# Patient Record
Sex: Female | Born: 1970 | Hispanic: Yes | Marital: Married | Smoking: Never smoker
Health system: Southern US, Community
[De-identification: ages and names within clinical notes are randomized; demographics above are authoritative.]

---

## 2013-10-07 ENCOUNTER — Emergency Department: Payer: Self-pay

## 2013-10-07 LAB — RAPID INFLUENZA A&B ANTIGENS (ARMC ONLY)

## 2013-10-30 ENCOUNTER — Ambulatory Visit: Payer: Self-pay

## 2017-11-21 ENCOUNTER — Ambulatory Visit: Payer: Self-pay | Attending: Oncology

## 2017-11-21 ENCOUNTER — Ambulatory Visit
Admission: RE | Admit: 2017-11-21 | Discharge: 2017-11-21 | Disposition: A | Discharge status: Home / Self Care | Payer: Self-pay | Source: Ambulatory Visit | Attending: Oncology | Admitting: Oncology

## 2017-11-21 VITALS — BP 119/81 | HR 80 | Temp 98.1°F | Ht 63.0 in | Wt 150.0 lb

## 2017-11-21 DIAGNOSIS — N63 Unspecified lump in unspecified breast: Secondary | ICD-10-CM

## 2017-11-21 NOTE — Progress Notes (Signed)
Subjective:     Patient ID: Pennie RushingReyna Lopez Alvarez, female   DOB: 03/29/1971, 47 y.o.   MRN: [id number]  HPI   Review of Systems     Objective:   Physical Exam  Pulmonary/Chest: Right breast exhibits mass. Right breast exhibits no inverted nipple, no nipple discharge, no skin change and no tenderness. Left breast exhibits no inverted nipple, no mass, no nipple discharge, no skin change and no tenderness. Breasts are symmetrical.    Bilateral upper outer quadrant thickening       Assessment:     47 year old patient presents for [facility] clinic visit.  Referred for right breast mass.  No previous mammogram.     Patient screened, and meets [facility] eligibility.  Patient does not have insurance, Medicare or Medicaid.  Handout given on Affordable Care Act. Instructed patient on breast self awareness using teach back method.  Clinical exam reveals a 1.5 cm.  Firm, mobile, right breast mass at 5 o'clock adjacent to areola.    Plan:     Sent for bilateral diagnostic mammogram, and ultrasound.

## 2017-11-27 ENCOUNTER — Other Ambulatory Visit: Payer: Self-pay

## 2017-11-27 DIAGNOSIS — N63 Unspecified lump in unspecified breast: Secondary | ICD-10-CM

## 2017-12-02 NOTE — Progress Notes (Signed)
Mammogram and ultrasound results, Birads 3.  Patient scheduled to return for a right breast ultrasound on 05/10/18 at 11:00.  Mailed appointment information to patient.  Copy to [facility].

## 2018-05-10 ENCOUNTER — Other Ambulatory Visit: Payer: Self-pay

## 2018-05-10 ENCOUNTER — Ambulatory Visit
Admission: RE | Admit: 2018-05-10 | Discharge: 2018-05-10 | Disposition: A | Discharge status: Home / Self Care | Payer: Self-pay | Source: Ambulatory Visit | Attending: Oncology | Admitting: Oncology

## 2018-05-10 DIAGNOSIS — N63 Unspecified lump in unspecified breast: Secondary | ICD-10-CM

## 2018-05-10 NOTE — Progress Notes (Signed)
Radiologist discussed Birads 3 findings with patient.  To be scheduled for 6 month follow-up and annual mammogram.  Copy to [facility].

## 2018-05-25 NOTE — Progress Notes (Signed)
Patient scheduled for 6 month follow-up mammogram and annual [facility] appointment on 11/29/18 at 8:30.  Letter mailed with appointment info.

## 2018-11-29 ENCOUNTER — Ambulatory Visit
Admission: RE | Admit: 2018-11-29 | Discharge: 2018-11-29 | Disposition: A | Discharge status: Home / Self Care | Payer: Self-pay | Source: Ambulatory Visit | Attending: Oncology | Admitting: Oncology

## 2018-11-29 ENCOUNTER — Other Ambulatory Visit: Payer: Self-pay

## 2018-11-29 ENCOUNTER — Ambulatory Visit: Payer: Self-pay

## 2018-11-29 DIAGNOSIS — N63 Unspecified lump in unspecified breast: Secondary | ICD-10-CM

## 2018-12-04 NOTE — Progress Notes (Addendum)
Patient returns for six month follow-up right breast mass, and annual mammogram.  She is scheduled for 11/29/2018.  Patient screened, and meets [facility] eligibility.  Patient does not have insurance, Medicare or Medicaid.  Handout given on Affordable Care Act.  Sent for diagnostic bilateral mammogram, and ultrasound.

## 2018-12-04 NOTE — Progress Notes (Signed)
Birads 3 mammogram results recommending annual diagnostic mammogram.  Scheduled for annual and follow-up on 12/05/2019 at 12:30.  Letter mailed to notify patient of appointment.

## 2019-05-09 ENCOUNTER — Other Ambulatory Visit: Payer: Self-pay

## 2019-05-09 DIAGNOSIS — Z20822 Contact with and (suspected) exposure to covid-19: Secondary | ICD-10-CM

## 2019-05-10 LAB — NOVEL CORONAVIRUS, NAA: SARS-CoV-2, NAA: NOT DETECTED

## 2019-05-11 ENCOUNTER — Other Ambulatory Visit: Payer: Self-pay | Admitting: *Deleted

## 2019-05-11 DIAGNOSIS — Z20822 Contact with and (suspected) exposure to covid-19: Secondary | ICD-10-CM

## 2019-05-13 LAB — NOVEL CORONAVIRUS, NAA: SARS-CoV-2, NAA: NOT DETECTED

## 2019-11-24 ENCOUNTER — Ambulatory Visit: Payer: Self-pay | Attending: Internal Medicine

## 2019-11-24 DIAGNOSIS — Z23 Encounter for immunization: Secondary | ICD-10-CM

## 2019-11-24 NOTE — Progress Notes (Signed)
   Covid-19 Vaccination Clinic  Name:  Sheila Lopez    MRN: [id number] DOB: 10-Apr-1971  11/24/2019  Ms. Sheila Lopez was observed post Covid-19 immunization for 15 minutes without incident. She was provided with Vaccine Information Sheet and instruction to access the V-Safe system.   Ms. Sheila Lopez was instructed to call 911 with any severe reactions post vaccine: Marland Kitchen Difficulty breathing  . Swelling of face and throat  . A fast heartbeat  . A bad rash all over body  . Dizziness and weakness   Immunizations Administered    Name Date Dose VIS Date Route   Pfizer COVID-19 Vaccine 11/24/2019  2:40 PM 0.3 mL 08/17/2019 Intramuscular   Manufacturer: [employer], [employer]   Lot: [id number]   NDC: [address]-6

## 2019-12-05 ENCOUNTER — Ambulatory Visit
Admission: RE | Admit: 2019-12-05 | Discharge: 2019-12-05 | Disposition: A | Discharge status: Home / Self Care | Payer: Self-pay | Source: Ambulatory Visit | Attending: Oncology | Admitting: Oncology

## 2019-12-05 ENCOUNTER — Ambulatory Visit: Payer: Self-pay | Attending: Oncology

## 2019-12-05 ENCOUNTER — Other Ambulatory Visit: Payer: Self-pay

## 2019-12-05 VITALS — BP 131/80 | HR 74 | Temp 97.1°F | Ht 61.5 in | Wt 153.4 lb

## 2019-12-05 DIAGNOSIS — Z0000 Encounter for general adult medical examination without abnormal findings: Secondary | ICD-10-CM | POA: Insufficient documentation

## 2019-12-05 DIAGNOSIS — N63 Unspecified lump in unspecified breast: Secondary | ICD-10-CM

## 2019-12-05 NOTE — Progress Notes (Signed)
  Subjective:     Patient ID: Sheila Lopez, female   DOB: 05-01-1971, 49 y.o.   MRN: [id number]  HPI   Review of Systems     Objective:   Physical Exam Chest:     Breasts:        Right: Mass present. No swelling, bleeding, inverted nipple, nipple discharge, skin change or tenderness.        Left: No swelling, bleeding, inverted nipple, mass, nipple discharge, skin change or tenderness.    Genitourinary:    Labia:        Right: No rash, tenderness, lesion or injury.        Left: No rash, tenderness, lesion or injury.      Vagina: No signs of injury and foreign body. No vaginal discharge, erythema, tenderness, bleeding, lesions or prolapsed vaginal walls.     Cervix: No cervical motion tenderness, discharge, friability, lesion, erythema, cervical bleeding or eversion.     Uterus: Not deviated, not fixed, not tender and no uterine prolapse.      Adnexa:        Right: No mass, tenderness or fullness.         Left: No mass, tenderness or fullness.          Assessment:     49 year old Hispanic patient returns for annual screening. Patient is being followed for bilateral breast masses. Sheila Lopez interpreted exam. Patient screened, and meets [facility] eligibility.  Patient does not have insurance, Medicare or Medicaid. Instructed patient on breast self awareness using teach back method. Clinical breast exam unremarkable. Bilateral upper outer quadrant thickening.  Pelvic exam normal.    Plan:     Specimen collected for pap. Sent for bilateral diagnostic mammogram/ultrasound

## 2019-12-07 LAB — IGP, APTIMA HPV: HPV Aptima: NEGATIVE

## 2019-12-15 ENCOUNTER — Ambulatory Visit: Payer: Self-pay | Attending: Internal Medicine

## 2019-12-15 DIAGNOSIS — Z23 Encounter for immunization: Secondary | ICD-10-CM

## 2019-12-15 NOTE — Progress Notes (Signed)
   Covid-19 Vaccination Clinic  Name:  Sheila Lopez    MRN: [id number] DOB: 1971-06-15  12/15/2019  Ms. Sheila Lopez was observed post Covid-19 immunization for 15 minutes without incident. She was provided with Vaccine Information Sheet and instruction to access the V-Safe system.   Ms. Sheila Lopez was instructed to call 911 with any severe reactions post vaccine: Marland Kitchen Difficulty breathing  . Swelling of face and throat  . A fast heartbeat  . A bad rash all over body  . Dizziness and weakness   Immunizations Administered    Name Date Dose VIS Date Route   Pfizer COVID-19 Vaccine 12/15/2019 11:12 AM 0.3 mL 08/17/2019 Intramuscular   Manufacturer: [employer], [employer]   Lot: [id number]   NDC: [address]-0

## 2020-03-27 NOTE — Progress Notes (Signed)
Notified patient of Birads 1 mammogram, and negative/negative pap results.  Patient  Instructed to return for annual screening.  Copy to [facility].

## 2021-11-19 IMAGING — MG DIGITAL DIAGNOSTIC BILAT W/ TOMO W/ CAD
8 series · 8 of 24 positions shown · non-contrast
Comparison: Previous exam(s).

CLINICAL DATA: Two year interval follow-up of a likely benign mass
involving the LOWER INNER QUADRANT of the RIGHT breast, possibly a
fibroadenoma, identified on baseline screening mammography in 3287.
Annual evaluation, LEFT breast.

EXAM:
DIGITAL DIAGNOSTIC BILATERAL MAMMOGRAM WITH CAD AND TOMO
ULTRASOUND RIGHT BREAST

[R MLO synth-2D]
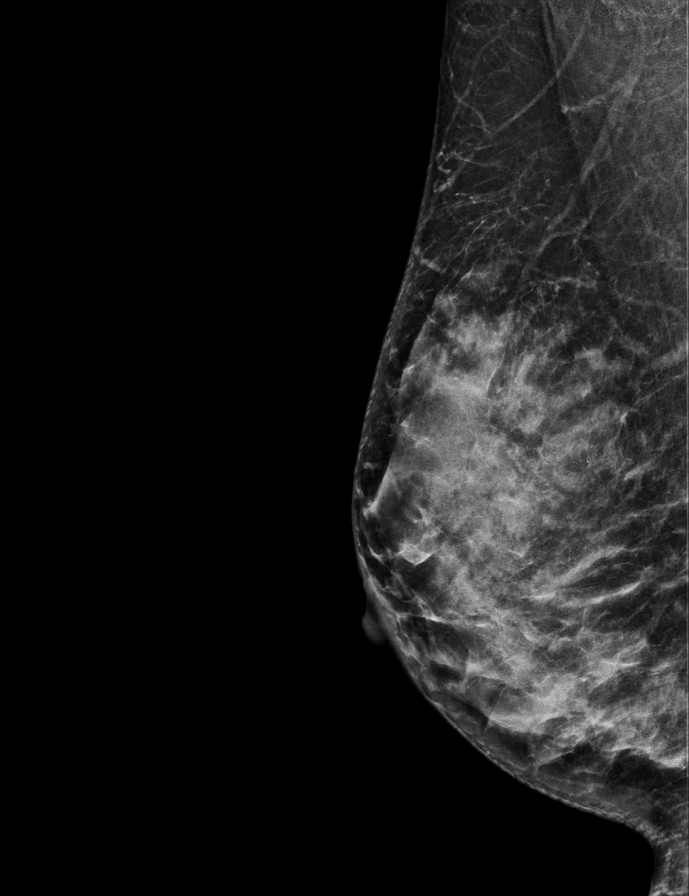

[L CC synth-2D]
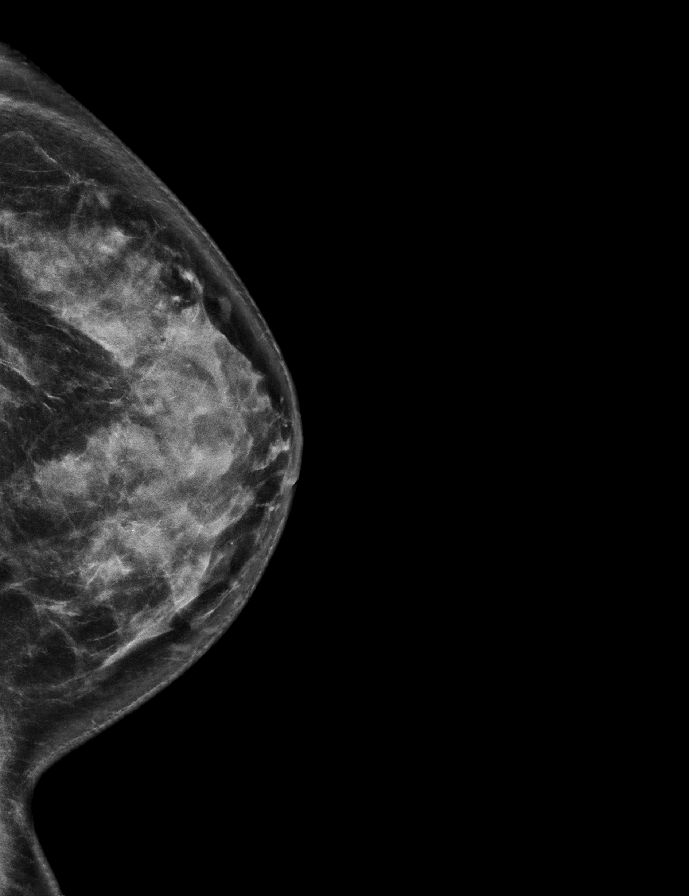

[L MLO synth-2D]
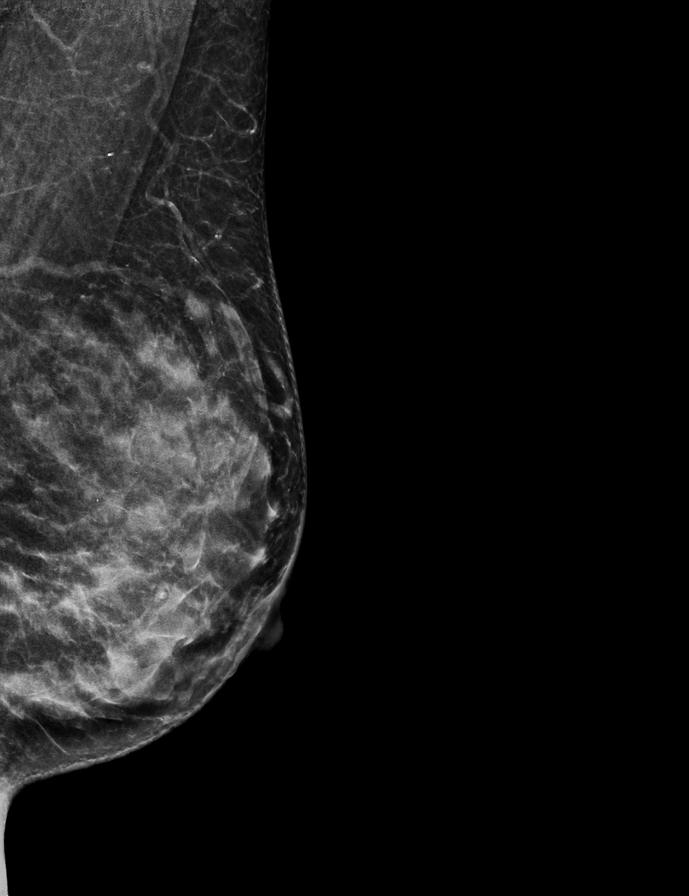

[R CC synth-2D]
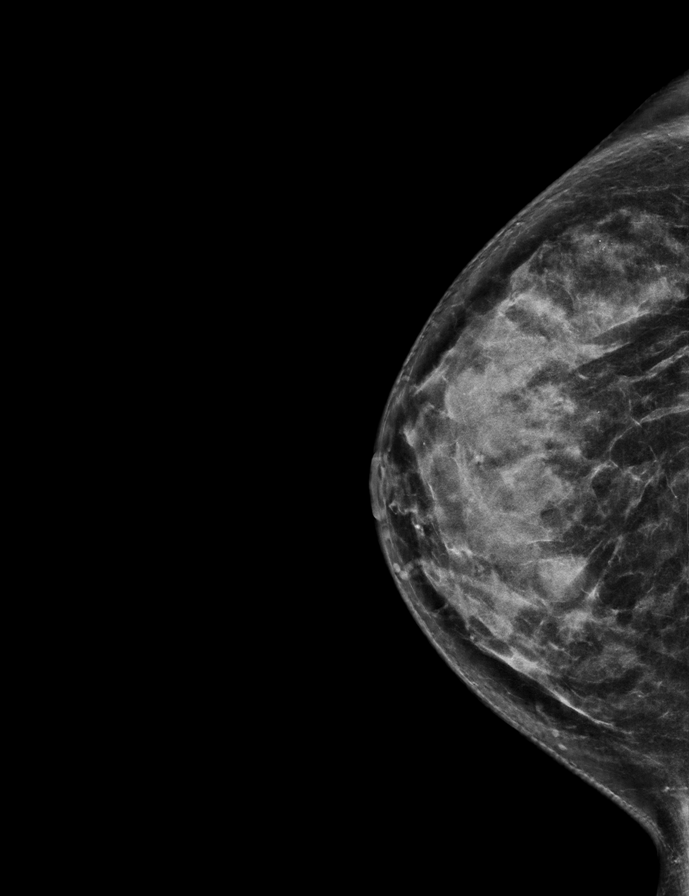

[R MLO tomo · tomo slice 31/62.0]
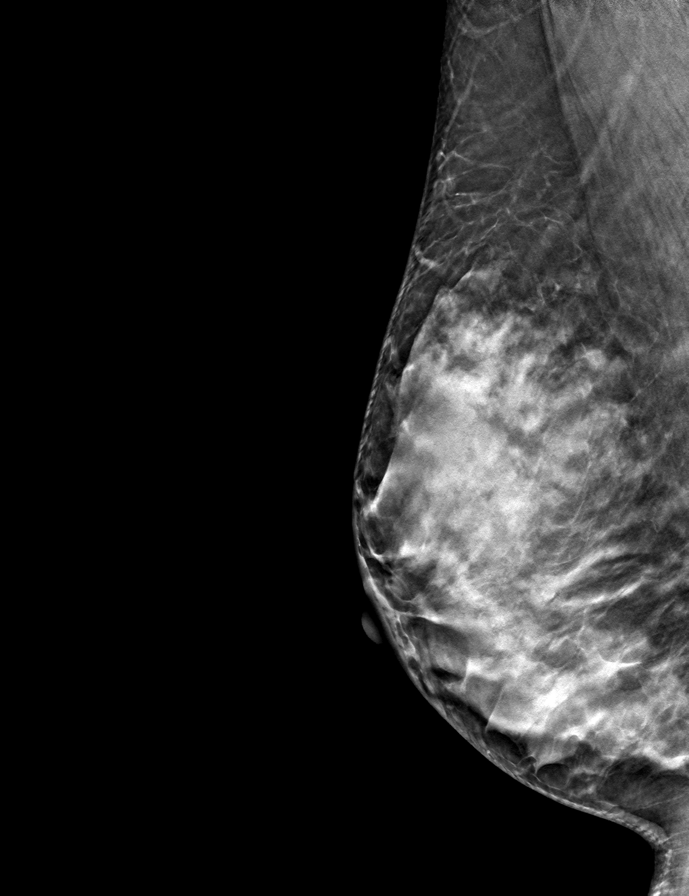

[L CC tomo · tomo slice 34/67.0]
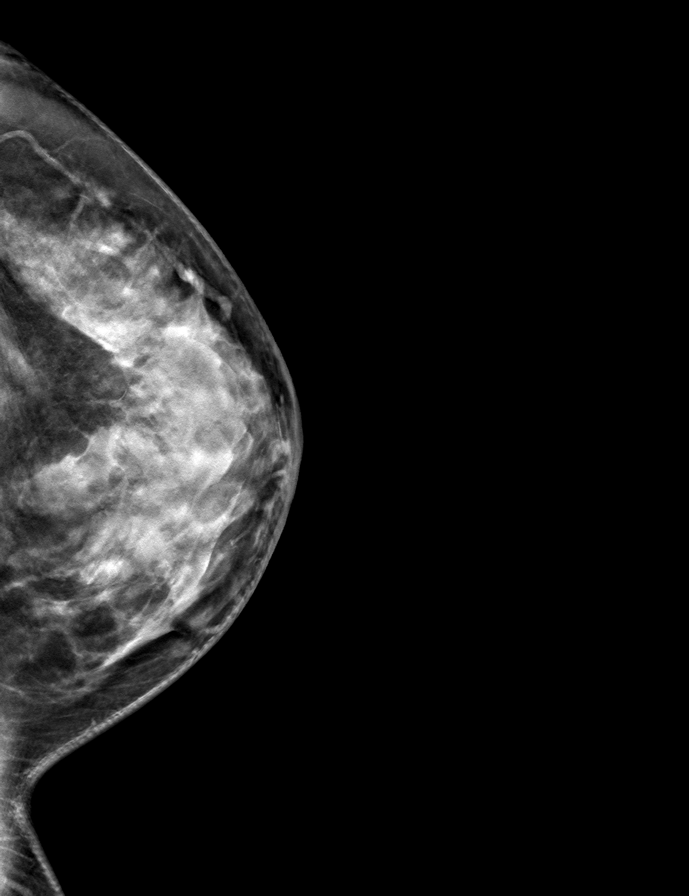

[R CC tomo · tomo slice 33/66.0]
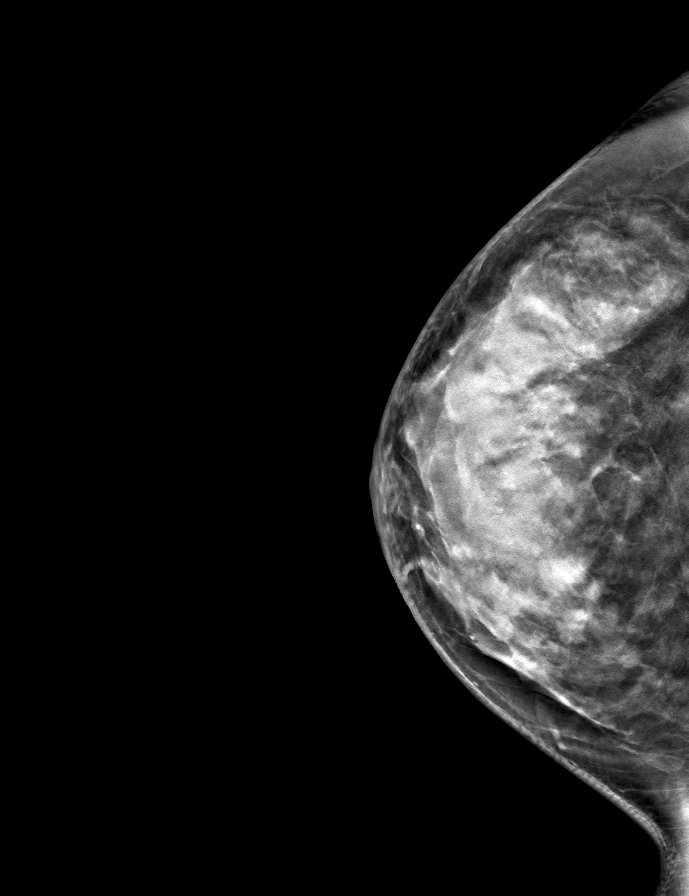

[L MLO tomo · tomo slice 29/58.0]
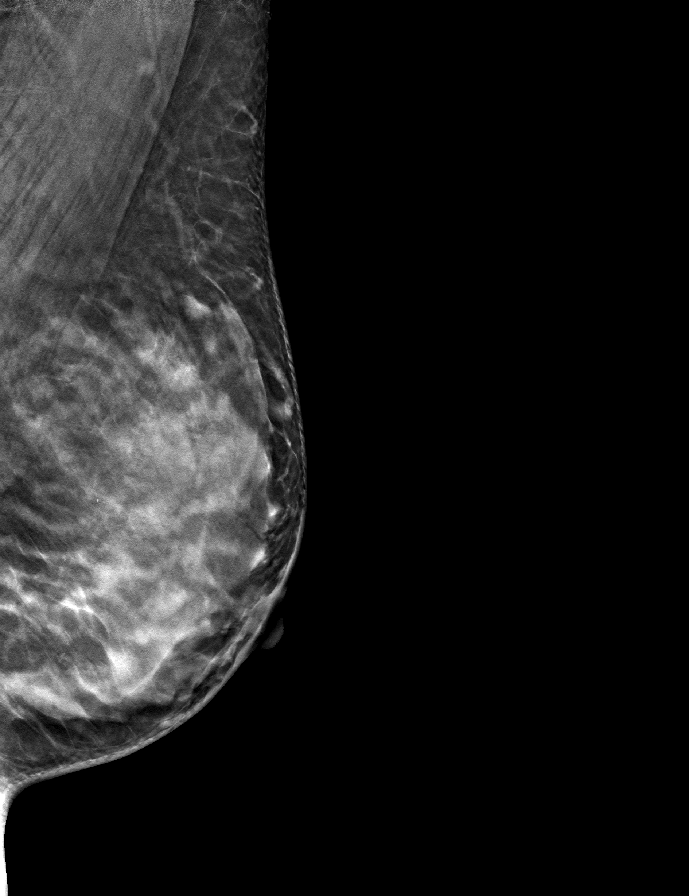

[8 of 24 positions shown; findings below may reference images not displayed]

ACR Breast Density Category d: The breast tissue is extremely dense,
which lowers the sensitivity of mammography.
FINDINGS: Tomosynthesis and synthesized full field CC and MLO views of both
breasts were obtained.

The partially obscured isodense mass involving the LOWER INNER
QUADRANT of the RIGHT breast, conspicuous on the CC images and
inconspicuous on the MLO images, is slightly decreased in size since
the prior mammograms. No new or suspicious findings elsewhere in the
RIGHT breast.

No findings suspicious for malignancy in the LEFT breast.

Mammographic images were processed with CAD.

Targeted RIGHT breast ultrasound is performed, showing the
previously identified circumscribed oval parallel hypoechoic mass at
the 5 o'clock position approximately 2 cm from nipple at ANTERIOR
depth, measuring approximately 0.9 x 1.3 x 1.2 cm (previously 1.2 x
2.0 x 1.6 cm on 11/29/2018 and 1.5 x 2.4 x 1.8 cm on 11/20/2017).
IMPRESSION: 1. No mammographic or sonographic evidence of malignancy involving
the RIGHT breast.
2. No mammographic evidence of malignancy involving the LEFT breast.
3. Interval decrease in size of the previously identified mass in
the LOWER INNER QUADRANT of the RIGHT breast, confirming benignity.
This mass likely represents a fibroadenoma.

RECOMMENDATION:
Screening mammogram in one year.(Code:GW-E-TGY)

I have discussed the findings and recommendations with the patient.
Communication with the patient was achieved with the assistance of a
certified interpreter. If applicable, a reminder letter will be sent
to the patient regarding the next appointment.

BI-RADS CATEGORY  2: Benign.

## 2022-06-17 ENCOUNTER — Encounter: Payer: Self-pay | Admitting: Physician Assistant

## 2022-08-02 ENCOUNTER — Other Ambulatory Visit: Payer: Self-pay

## 2022-08-02 DIAGNOSIS — Z1231 Encounter for screening mammogram for malignant neoplasm of breast: Secondary | ICD-10-CM

## 2022-08-03 ENCOUNTER — Ambulatory Visit: Payer: Self-pay | Attending: Hematology and Oncology | Admitting: Hematology and Oncology

## 2022-08-03 ENCOUNTER — Other Ambulatory Visit: Payer: Self-pay

## 2022-08-03 ENCOUNTER — Ambulatory Visit
Admission: RE | Admit: 2022-08-03 | Discharge: 2022-08-03 | Disposition: A | Discharge status: Home / Self Care | Payer: Self-pay | Source: Ambulatory Visit | Attending: Obstetrics and Gynecology | Admitting: Obstetrics and Gynecology

## 2022-08-03 VITALS — BP 167/92 | Wt 158.7 lb

## 2022-08-03 DIAGNOSIS — Z1231 Encounter for screening mammogram for malignant neoplasm of breast: Secondary | ICD-10-CM | POA: Insufficient documentation

## 2022-08-03 DIAGNOSIS — Z1211 Encounter for screening for malignant neoplasm of colon: Secondary | ICD-10-CM

## 2022-08-03 NOTE — Patient Instructions (Signed)
Taught Sheila Lopez about self breast awareness. Patient did not need a Pap smear today due to last Pap smear was in 2021 per patient. Let her know [facility] will cover Pap smears every 5 years unless has a history of abnormal Pap smears. Referred [facility] to the [facility] for diagnostic mammogram. Appointment scheduled for 08/03/22. Patient aware of appointment and will be there. Let patient know will follow up with her within the next couple weeks with results. Sheila Lopez verbalized understanding. Pap smear due in 2026, mammogram due next year.   Pascal Lux, NP 2:06 PM

## 2022-08-03 NOTE — Progress Notes (Signed)
Ms. Sheila Lopez is a 51 y.o. female who presents to [facility] clinic today with no complaints.    Pap Smear: Pap not smear completed today. Last Pap smear was 2021 at [facility] and was normal. Per patient has no history of an abnormal Pap smear. Last Pap smear result is available in Epic.   Physical exam: Breasts Breasts symmetrical. No skin abnormalities bilateral breasts. No nipple retraction bilateral breasts. No nipple discharge bilateral breasts. No lymphadenopathy. No lumps palpated bilateral breasts.     MS DIGITAL DIAG TOMO BILAT  Result Date: 12/05/2019 CLINICAL DATA:  Two year interval follow-up of a likely benign mass involving the LOWER INNER QUADRANT of the RIGHT breast, possibly a fibroadenoma, identified on baseline screening mammography in 2019. Annual evaluation, LEFT breast. EXAM: DIGITAL DIAGNOSTIC BILATERAL MAMMOGRAM WITH CAD AND TOMO ULTRASOUND RIGHT BREAST COMPARISON:  Previous exam(s). ACR Breast Density Category d: The breast tissue is extremely dense, which lowers the sensitivity of mammography. FINDINGS: Tomosynthesis and synthesized full field CC and MLO views of both breasts were obtained. The partially obscured isodense mass involving the LOWER INNER QUADRANT of the RIGHT breast, conspicuous on the CC images and inconspicuous on the MLO images, is slightly decreased in size since the prior mammograms. No new or suspicious findings elsewhere in the RIGHT breast. No findings suspicious for malignancy in the LEFT breast. Mammographic images were processed with CAD. Targeted RIGHT breast ultrasound is performed, showing the previously identified circumscribed oval parallel hypoechoic mass at the 5 o'clock position approximately 2 cm from nipple at ANTERIOR depth, measuring approximately 0.9 x 1.3 x 1.2 cm (previously 1.2 x 2.0 x 1.6 cm on 11/29/2018 and 1.5 x 2.4 x 1.8 cm on 11/20/2017). IMPRESSION: 1. No mammographic or sonographic evidence of malignancy involving the  RIGHT breast. 2. No mammographic evidence of malignancy involving the LEFT breast. 3. Interval decrease in size of the previously identified mass in the LOWER INNER QUADRANT of the RIGHT breast, confirming benignity. 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 RECOMMENDATION: Screening mammogram in one year.(Code:SM-B-01Y) I have discussed the findings and recommendations with the patient. Communication with the patient was achieved with the assistance of a certified interpreter. If applicable, a reminder letter will be sent to the patient regarding the next appointment. BI-RADS CATEGORY  2: Benign. Electronically Signed   By: Hulan Saas M.D.   On: 12/05/2019 14:16   MS DIGITAL DIAG TOMO BILAT  Result Date: 11/29/2018 CLINICAL DATA:  Twelve month follow-up of a medial right breast probably benign mass. EXAM: DIGITAL DIAGNOSTIC BILATERAL MAMMOGRAM WITH CAD AND TOMO ULTRASOUND BILATERAL BREAST COMPARISON:  Previous exam(s). ACR Breast Density Category c: The breast tissue is heterogeneously dense, which may obscure small masses. FINDINGS: The mass in the medial right breast is stable mammographically. There appears to be a small mass in the medial left breast on the CC view as well, not as well seen on spot imaging. A few scattered calcifications in the upper outer right breast are stable. No other suspicious abnormalities in either breast. Mammographic images were processed with CAD. On physical exam, no suspicious lumps are identified. Targeted ultrasound is performed, showing a simple cyst at 8:30 in the left periareolar region. The mass in the right breast at 5 o'clock, 2 cm from the nipple measures 1.6 x 2.0 x 1.2 cm today versus 1.5 x 2.1 x 1.3 cm previously, not significantly changed. IMPRESSION: Stable probably benign right breast mass. Fibrocystic changes on the left. RECOMMENDATION: Twelve month follow-up  ultrasound of the probably benign right breast mass. The patient will be due for bilateral  mammography at that time. I have discussed the findings and recommendations with the patient. Results were also provided in writing at the conclusion of the visit. If applicable, a reminder letter will be sent to the patient regarding the next appointment. BI-RADS CATEGORY  3: Probably benign. Electronically Signed   By: Gerome Sam III M.D   On: 11/29/2018 11:39   MS DIGITAL DIAG TOMO BILAT  Result Date: 11/21/2017 CLINICAL DATA:  Patient palpates a mass in the RIGHT breast. Symptoms for 4 months. Baseline study. EXAM: DIGITAL DIAGNOSTIC BILATERAL MAMMOGRAM WITH CAD AND TOMO ULTRASOUND RIGHT BREAST COMPARISON:  None ACR Breast Density Category c: The breast tissue is heterogeneously dense, which may obscure small masses. FINDINGS: Within the LOWER INNER QUADRANT of the RIGHT breast, there is a partially obscured round mass marked with a BB as palpable. LEFT breast is negative. Mammographic images were processed with CAD. On physical exam, I palpate a discrete mobile mass in the LOWER INNER QUADRANT of the RIGHT breast. Targeted ultrasound is performed, showing a circumscribed solid oval mass with posterior acoustic enhancement in the 5 o'clock location RIGHT breast 2 centimeters from the nipple. There is associated internal blood flow. Mass measures 2.4 x 1.5 by 1.8 centimeters. Evaluation of the axilla is negative for adenopathy. IMPRESSION: 1. Circumscribed solid mass corresponding to the palpable abnormality. Findings favor benign fibroadenoma. We discussed management options including excision, biopsy, and close follow-up. Imaging followup is recommended at 6, 12, and 24 months to assess stability. The patient concurs with this plan. 2. LEFT breast is negative. RECOMMENDATION: RIGHT breast ultrasound is recommended in 6 months. I have discussed the findings and recommendations with the patient with the assistance of an interpreter on the Language Line. Results were also provided in writing at the  conclusion of the visit. If applicable, a reminder letter will be sent to the patient regarding the next appointment. BI-RADS CATEGORY  3: Probably benign. Electronically Signed   By: Norva Pavlov M.D.   On: 11/21/2017 11:54      Pelvic/Bimanual Pap is not indicated today    Smoking History: Patient has never smoked and was not referred to quit line.    Patient Navigation: Patient education provided. Access to services provided for patient through [facility] program. Lowella Fairy interpreter provided. No transportation provided   Colorectal Cancer Screening: Per patient has never had colonoscopy completed No complaints today. FIT test given   Breast and Cervical Cancer Risk Assessment: Patient does not have family history of breast cancer, known genetic mutations, or radiation treatment to the chest before age 68. Patient does not have history of cervical dysplasia, immunocompromised, or DES exposure in-utero.  Risk Assessment   No risk assessment data for the current encounter  Risk Scores       12/05/2019   Last edited by: Jim Like, RN   5-year risk: 0.4 %   Lifetime risk: 4.4 %            A: [facility] exam without pap smear No complaints with benign exam.   P: Referred patient to the [facility] for a screening mammogram. Appointment scheduled 08/03/22.  Pascal Lux, NP 08/03/2022 1:43 PM

## 2022-08-19 LAB — FECAL OCCULT BLOOD, IMMUNOCHEMICAL: Fecal Occult Bld: NEGATIVE

## 2022-09-09 ENCOUNTER — Telehealth: Payer: Self-pay

## 2022-09-09 NOTE — Telephone Encounter (Signed)
Normal FIT test letter mailed.   Enero 4, 2024     Estimado(a) [name],   Gracias por realizar la prueba FIT (prueba inmunoqumica fecal) como parte de la evaluacin para la deteccin del Surveyor, minerals. Nos complace informarle que el resultado de su prueba ha sido negativo.  La prueba FIT no es un examen completo para la deteccin del cncer colorrectal. No todos los cnceres colorrectales se detectan mediante esta prueba. Es importante que le com[employer] a su mdico. Adems, le recomendamos que por favor vaya a su mdico si presenta sntomas como cambios en los hbitos intestinales, dolor abdominal o sangre en las heces fecales.  Si tiene [employer] de estas pruebas, llame a [employer] al  [phone].  Atentamente,    Marjory Lies, LPN [facility]/Community Outreach (Programa comunitario)

## 2023-07-16 ENCOUNTER — Ambulatory Visit: Payer: Self-pay

## 2023-07-16 DIAGNOSIS — Z23 Encounter for immunization: Secondary | ICD-10-CM

## 2023-09-09 ENCOUNTER — Other Ambulatory Visit: Payer: Self-pay

## 2023-09-09 DIAGNOSIS — Z1231 Encounter for screening mammogram for malignant neoplasm of breast: Secondary | ICD-10-CM

## 2023-10-03 ENCOUNTER — Ambulatory Visit: Payer: Self-pay | Attending: Hematology and Oncology | Admitting: *Deleted

## 2023-10-03 ENCOUNTER — Ambulatory Visit
Admission: RE | Admit: 2023-10-03 | Discharge: 2023-10-03 | Disposition: A | Discharge status: Home / Self Care | Payer: Self-pay | Source: Ambulatory Visit | Attending: Obstetrics and Gynecology | Admitting: Obstetrics and Gynecology

## 2023-10-03 ENCOUNTER — Other Ambulatory Visit: Payer: Self-pay

## 2023-10-03 VITALS — BP 153/92 | Wt 148.5 lb

## 2023-10-03 DIAGNOSIS — Z1211 Encounter for screening for malignant neoplasm of colon: Secondary | ICD-10-CM

## 2023-10-03 DIAGNOSIS — Z1239 Encounter for other screening for malignant neoplasm of breast: Secondary | ICD-10-CM

## 2023-10-03 DIAGNOSIS — Z1231 Encounter for screening mammogram for malignant neoplasm of breast: Secondary | ICD-10-CM | POA: Insufficient documentation

## 2023-10-03 NOTE — Patient Instructions (Signed)
Explained breast self awareness with Curly Rim. Patient did not need a Pap smear today due to last Pap smear and HPV typing was 11/17/2019. Let her know [facility] will cover Pap smears and HPV typing every 5 years unless has a history of abnormal Pap smears. Referred patient to the [facility] for a screening mammogram. Appointment scheduled Monday, October 03, 2023 at 1020. Patient aware of appointment and will be there. Let patient know Delford Field will follow up with her within the next couple weeks with results of mammogram by letter or phone. Curly Rim verbalized understanding.  Kemond Amorin, Kathaleen Maser, RN 10:05 AM

## 2023-10-03 NOTE — Progress Notes (Signed)
Sheila Lopez is a 53 y.o. female who presents to [facility] clinic today with no complaints.    Pap Smear: Pap smear not completed today. Last Pap smear was 11/17/2019 at [facility] clinic and was normal with negative HPV. Per patient has no history of an abnormal Pap smear. Last Pap smear result is available in Epic.   Physical exam: Breasts Breasts symmetrical. No skin abnormalities bilateral breasts. No nipple retraction bilateral breasts. No nipple discharge bilateral breasts. No lymphadenopathy. No lumps palpated bilateral breasts. No complaints of pain or tenderness on exam.     MS DIGITAL SCREENING TOMO BILATERAL Result Date: 08/04/2022 CLINICAL DATA:  Screening. EXAM: DIGITAL SCREENING BILATERAL MAMMOGRAM WITH TOMOSYNTHESIS AND CAD TECHNIQUE: Bilateral screening digital craniocaudal and mediolateral oblique mammograms were obtained. Bilateral screening digital breast tomosynthesis was performed. The images were evaluated with computer-aided detection. COMPARISON:  Previous exam(s). ACR Breast Density Category d: The breast tissue is extremely dense, which lowers the sensitivity of mammography FINDINGS: There are no findings suspicious for malignancy. IMPRESSION: No mammographic evidence of malignancy. A result letter of this screening mammogram will be mailed directly to the patient. RECOMMENDATION: Screening mammogram in one year. (Code:SM-B-01Y) BI-RADS CATEGORY  1: Negative. Electronically Signed   By: Sande Brothers M.D.   On: 08/04/2022 11:49   MS DIGITAL DIAG TOMO BILAT Result Date: 12/05/2019 CLINICAL DATA:  Two year interval follow-up of a likely benign mass involving the LOWER INNER QUADRANT of the RIGHT breast, possibly a fibroadenoma, identified on baseline screening mammography in 2019. Annual evaluation, LEFT breast. EXAM: DIGITAL DIAGNOSTIC BILATERAL MAMMOGRAM WITH CAD AND TOMO ULTRASOUND RIGHT BREAST COMPARISON:  Previous exam(s). ACR Breast Density Category d: The breast  tissue is extremely dense, which lowers the sensitivity of mammography. FINDINGS: Tomosynthesis and synthesized full field CC and MLO views of both breasts were obtained. The partially obscured isodense mass involving the LOWER INNER QUADRANT of the RIGHT breast, conspicuous on the CC images and inconspicuous on the MLO images, is slightly decreased in size since the prior mammograms. No new or suspicious findings elsewhere in the RIGHT breast. No findings suspicious for malignancy in the LEFT breast. Mammographic images were processed with CAD. Targeted RIGHT breast ultrasound is performed, showing the previously identified circumscribed oval parallel hypoechoic mass at the 5 o'clock position approximately 2 cm from nipple at ANTERIOR depth, measuring approximately 0.9 x 1.3 x 1.2 cm (previously 1.2 x 2.0 x 1.6 cm on 11/29/2018 and 1.5 x 2.4 x 1.8 cm on 11/20/2017). IMPRESSION: 1. No mammographic or sonographic evidence of malignancy involving the RIGHT breast. 2. No mammographic evidence of malignancy involving the LEFT breast. 3. Interval decrease in size of the previously identified mass in the LOWER INNER QUADRANT of the RIGHT breast, confirming benignity. 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 RECOMMENDATION: Screening mammogram in one year.(Code:SM-B-01Y) I have discussed the findings and recommendations with the patient. Communication with the patient was achieved with the assistance of a certified interpreter. If applicable, a reminder letter will be sent to the patient regarding the next appointment. BI-RADS CATEGORY  2: Benign. Electronically Signed   By: Hulan Saas M.D.   On: 12/05/2019 14:16   MS DIGITAL DIAG TOMO BILAT Result Date: 11/29/2018 CLINICAL DATA:  Twelve month follow-up of a medial right breast probably benign mass. EXAM: DIGITAL DIAGNOSTIC BILATERAL MAMMOGRAM WITH CAD AND TOMO ULTRASOUND BILATERAL BREAST COMPARISON:  Previous exam(s). ACR Breast Density Category c: The breast  tissue is heterogeneously dense, which may obscure small  masses. FINDINGS: The mass in the medial right breast is stable mammographically. There appears to be a small mass in the medial left breast on the CC view as well, not as well seen on spot imaging. A few scattered calcifications in the upper outer right breast are stable. No other suspicious abnormalities in either breast. Mammographic images were processed with CAD. On physical exam, no suspicious lumps are identified. Targeted ultrasound is performed, showing a simple cyst at 8:30 in the left periareolar region. The mass in the right breast at 5 o'clock, 2 cm from the nipple measures 1.6 x 2.0 x 1.2 cm today versus 1.5 x 2.1 x 1.3 cm previously, not significantly changed. IMPRESSION: Stable probably benign right breast mass. Fibrocystic changes on the left. RECOMMENDATION: Twelve month follow-up ultrasound of the probably benign right breast mass. The patient will be due for bilateral mammography at that time. I have discussed the findings and recommendations with the patient. Results were also provided in writing at the conclusion of the visit. If applicable, a reminder letter will be sent to the patient regarding the next appointment. BI-RADS CATEGORY  3: Probably benign. Electronically Signed   By: Gerome Sam III M.D   On: 11/29/2018 11:39   Pelvic/Bimanual Pap is not indicated today per B[facility]CP guidelines.   Smoking History: Patient has never smoked.   Patient Navigation: Patient education provided. Access to services provided for patient through [employer] program. Spanish interpreter Sheila Lopez from [facility] provided.   Colorectal Cancer Screening: Per patient has never had colonoscopy completed. FIT Test completed 08/03/2022 at [facility] negative. FIT Test given to patient today to complete. No complaints today.    Breast and Cervical Cancer Risk Assessment: Patient does not have family history of breast cancer, known genetic  mutations, or radiation treatment to the chest before age 45. Patient does not have history of cervical dysplasia, immunocompromised, or DES exposure in-utero.  Risk Scores as of Encounter on 10/03/2023     Sheila Lopez           5-year 0.66%   Lifetime 5.86%   This patient is [place]/Latina but has no documented birth country, so the [place] model used data from [place] patients to calculate their risk score. Document a birth country in the Demographics activity for a more accurate score.         Last calculated by Sheila Rutherford, LPN on 0/27/2536 at  9:58 AM        A: [facility] exam without pap smear No complaints  P: Referred patient to the [facility] for a screening mammogram. Appointment scheduled Monday, October 03, 2023 at 1020.  Sheila Heidelberg, RN 10/03/2023 10:05 AM

## 2023-10-04 ENCOUNTER — Other Ambulatory Visit: Payer: Self-pay | Admitting: Obstetrics and Gynecology

## 2023-10-04 DIAGNOSIS — R928 Other abnormal and inconclusive findings on diagnostic imaging of breast: Secondary | ICD-10-CM

## 2023-10-06 ENCOUNTER — Encounter: Payer: Self-pay | Admitting: Primary Care

## 2023-10-07 ENCOUNTER — Ambulatory Visit
Admission: RE | Admit: 2023-10-07 | Discharge: 2023-10-07 | Disposition: A | Discharge status: Home / Self Care | Payer: Self-pay | Source: Ambulatory Visit | Attending: Obstetrics and Gynecology | Admitting: Obstetrics and Gynecology

## 2023-10-07 ENCOUNTER — Other Ambulatory Visit: Payer: Self-pay | Admitting: Obstetrics and Gynecology

## 2023-10-07 DIAGNOSIS — R921 Mammographic calcification found on diagnostic imaging of breast: Secondary | ICD-10-CM

## 2023-10-07 DIAGNOSIS — R928 Other abnormal and inconclusive findings on diagnostic imaging of breast: Secondary | ICD-10-CM

## 2023-10-14 ENCOUNTER — Ambulatory Visit
Admission: RE | Admit: 2023-10-14 | Discharge: 2023-10-14 | Disposition: A | Discharge status: Home / Self Care | Payer: Self-pay | Source: Ambulatory Visit | Attending: Obstetrics and Gynecology | Admitting: Obstetrics and Gynecology

## 2023-10-14 DIAGNOSIS — R921 Mammographic calcification found on diagnostic imaging of breast: Secondary | ICD-10-CM | POA: Insufficient documentation

## 2023-10-14 DIAGNOSIS — R928 Other abnormal and inconclusive findings on diagnostic imaging of breast: Secondary | ICD-10-CM | POA: Insufficient documentation

## 2023-10-14 HISTORY — PX: BREAST BIOPSY: SHX20

## 2023-10-14 MED ORDER — LIDOCAINE 1 % OPTIME INJ - NO CHARGE
5.0000 mL | Freq: Once | INTRAMUSCULAR | Status: AC
  Administered 2023-10-14: 5 mL
  Filled 2023-10-14: qty 6

## 2023-10-14 MED ORDER — LIDOCAINE-EPINEPHRINE 1 %-1:100000 IJ SOLN
20.0000 mL | Freq: Once | INTRAMUSCULAR | Status: AC
  Administered 2023-10-14: 20 mL
  Filled 2023-10-14: qty 20

## 2023-10-17 ENCOUNTER — Inpatient Hospital Stay: Admission: RE | Admit: 2023-10-17 | Payer: Self-pay | Source: Ambulatory Visit

## 2023-10-17 LAB — SURGICAL PATHOLOGY

## 2023-11-03 LAB — FECAL OCCULT BLOOD, IMMUNOCHEMICAL

## 2023-11-15 ENCOUNTER — Telehealth: Payer: Self-pay

## 2023-11-15 ENCOUNTER — Other Ambulatory Visit: Payer: Self-pay

## 2023-11-15 DIAGNOSIS — Z1211 Encounter for screening for malignant neoplasm of colon: Secondary | ICD-10-CM

## 2023-11-15 NOTE — Telephone Encounter (Signed)
 Via Kristeen Mans, [facility] Spanish Interpreter, Patient informed FIT test was received by the lab, but specimen was too old to process due to delay in transport, need to know if interested in completed another FIT test kit. Patient stated that she is interested in doing another FIT Test. Patient informed will mail kit to her home address, verified address in chart is correct.

## 2024-01-28 LAB — FECAL OCCULT BLOOD, IMMUNOCHEMICAL: Fecal Occult Bld: NEGATIVE
